# Patient Record
Sex: Male | Born: 2000
Health system: Southern US, Community
[De-identification: ages and names within clinical notes are randomized; demographics above are authoritative.]

## PROBLEM LIST (undated history)

## (undated) DIAGNOSIS — K7290 Hepatic failure, unspecified without coma: Secondary | ICD-10-CM

## (undated) HISTORY — PX: LIVER TRANSPLANT: SHX410

---
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
## 2020-03-07 ENCOUNTER — Ambulatory Visit: Payer: Self-pay

## 2020-07-28 DIAGNOSIS — Z944 Liver transplant status: Secondary | ICD-10-CM | POA: Diagnosis not present

## 2020-09-07 ENCOUNTER — Other Ambulatory Visit (HOSPITAL_COMMUNITY): Payer: Self-pay | Admitting: Pediatric Gastroenterology

## 2020-09-11 DIAGNOSIS — Z419 Encounter for procedure for purposes other than remedying health state, unspecified: Secondary | ICD-10-CM | POA: Diagnosis not present

## 2020-10-12 DIAGNOSIS — Z419 Encounter for procedure for purposes other than remedying health state, unspecified: Secondary | ICD-10-CM | POA: Diagnosis not present

## 2020-10-19 DIAGNOSIS — Z944 Liver transplant status: Secondary | ICD-10-CM | POA: Diagnosis not present

## 2020-11-11 DIAGNOSIS — Z419 Encounter for procedure for purposes other than remedying health state, unspecified: Secondary | ICD-10-CM | POA: Diagnosis not present

## 2020-12-12 DIAGNOSIS — Z419 Encounter for procedure for purposes other than remedying health state, unspecified: Secondary | ICD-10-CM | POA: Diagnosis not present

## 2021-01-12 DIAGNOSIS — Z419 Encounter for procedure for purposes other than remedying health state, unspecified: Secondary | ICD-10-CM | POA: Diagnosis not present

## 2021-02-11 DIAGNOSIS — Z944 Liver transplant status: Secondary | ICD-10-CM | POA: Diagnosis not present

## 2021-02-11 DIAGNOSIS — D849 Immunodeficiency, unspecified: Secondary | ICD-10-CM | POA: Diagnosis not present

## 2021-02-11 DIAGNOSIS — Q447 Other congenital malformations of liver: Secondary | ICD-10-CM | POA: Diagnosis not present

## 2021-02-11 DIAGNOSIS — Z0184 Encounter for antibody response examination: Secondary | ICD-10-CM | POA: Diagnosis not present

## 2021-03-22 ENCOUNTER — Other Ambulatory Visit (HOSPITAL_COMMUNITY): Payer: Self-pay

## 2021-03-22 MED FILL — Tacrolimus Cap 0.5 MG: ORAL | 30 days supply | Qty: 240 | Fill #0 | Status: AC

## 2021-04-05 ENCOUNTER — Emergency Department (HOSPITAL_COMMUNITY)
Admission: EM | Admit: 2021-04-05 | Discharge: 2021-04-05 | Disposition: A | Discharge status: Home / Self Care | Payer: Medicaid Other | Attending: Emergency Medicine | Admitting: Emergency Medicine

## 2021-04-05 ENCOUNTER — Other Ambulatory Visit: Payer: Self-pay

## 2021-04-05 ENCOUNTER — Encounter (HOSPITAL_COMMUNITY): Payer: Self-pay

## 2021-04-05 ENCOUNTER — Emergency Department (HOSPITAL_COMMUNITY): Payer: Medicaid Other

## 2021-04-05 DIAGNOSIS — R1010 Upper abdominal pain, unspecified: Secondary | ICD-10-CM | POA: Insufficient documentation

## 2021-04-05 DIAGNOSIS — R509 Fever, unspecified: Secondary | ICD-10-CM

## 2021-04-05 DIAGNOSIS — R000 Tachycardia, unspecified: Secondary | ICD-10-CM | POA: Insufficient documentation

## 2021-04-05 DIAGNOSIS — R112 Nausea with vomiting, unspecified: Secondary | ICD-10-CM | POA: Insufficient documentation

## 2021-04-05 DIAGNOSIS — Z944 Liver transplant status: Secondary | ICD-10-CM | POA: Insufficient documentation

## 2021-04-05 DIAGNOSIS — R197 Diarrhea, unspecified: Secondary | ICD-10-CM | POA: Diagnosis not present

## 2021-04-05 DIAGNOSIS — R109 Unspecified abdominal pain: Secondary | ICD-10-CM | POA: Diagnosis not present

## 2021-04-05 LAB — COMPREHENSIVE METABOLIC PANEL
ALT: 26 U/L (ref 0–44)
AST: 23 U/L (ref 15–41)
Albumin: 3.6 g/dL (ref 3.5–5.0)
Alkaline Phosphatase: 103 U/L (ref 38–126)
Anion gap: 10 (ref 5–15)
BUN: 16 mg/dL (ref 6–20)
CO2: 22 mmol/L (ref 22–32)
Calcium: 8.6 mg/dL — ABNORMAL LOW (ref 8.9–10.3)
Chloride: 101 mmol/L (ref 98–111)
Creatinine, Ser: 1.06 mg/dL (ref 0.61–1.24)
GFR, Estimated: >60 mL/min (ref >60)
Glucose, Bld: 109 mg/dL — ABNORMAL HIGH (ref 70–99)
Potassium: 3.5 mmol/L (ref 3.5–5.1)
Sodium: 133 mmol/L — ABNORMAL LOW (ref 135–145)
Total Bilirubin: 5.8 mg/dL — ABNORMAL HIGH (ref 0.3–1.2)
Total Protein: 7.1 g/dL (ref 6.5–8.1)

## 2021-04-05 LAB — PROTIME-INR
INR: 1.2 (ref 0.8–1.2)
Prothrombin Time: 15.3 seconds — ABNORMAL HIGH (ref 11.4–15.2)

## 2021-04-05 LAB — CBC
HCT: 44.3 % (ref 39.0–52.0)
Hemoglobin: 15.3 g/dL (ref 13.0–17.0)
MCH: 29.8 pg (ref 26.0–34.0)
MCHC: 34.5 g/dL (ref 30.0–36.0)
MCV: 86.4 fL (ref 80.0–100.0)
Platelets: 155 10*3/uL (ref 150–400)
RBC: 5.13 MIL/uL (ref 4.22–5.81)
RDW: 12 % (ref 11.5–15.5)
WBC: 5.3 10*3/uL (ref 4.0–10.5)
nRBC: 0 % (ref 0.0–0.2)

## 2021-04-05 LAB — URINALYSIS, ROUTINE W REFLEX MICROSCOPIC
Bacteria, UA: NONE SEEN
Bilirubin Urine: NEGATIVE
Glucose, UA: NEGATIVE mg/dL
Ketones, ur: NEGATIVE mg/dL
Leukocytes,Ua: NEGATIVE
Nitrite: NEGATIVE
Protein, ur: 30 mg/dL — AB
Specific Gravity, Urine: 1.023 (ref 1.005–1.030)
pH: 5 (ref 5.0–8.0)

## 2021-04-05 LAB — LACTIC ACID, PLASMA
Lactic Acid, Venous: 1 mmol/L (ref 0.5–1.9)
Lactic Acid, Venous: 1.8 mmol/L (ref 0.5–1.9)

## 2021-04-05 LAB — BILIRUBIN, DIRECT: Bilirubin, Direct: 0.2 mg/dL (ref 0.0–0.2)

## 2021-04-05 LAB — LIPASE, BLOOD: Lipase: 25 U/L (ref 11–51)

## 2021-04-05 MED ORDER — SODIUM CHLORIDE 0.9 % IV BOLUS
1000.0000 mL | Freq: Once | INTRAVENOUS | Status: AC
  Administered 2021-04-05: 1000 mL via INTRAVENOUS

## 2021-04-05 MED ORDER — IOHEXOL 300 MG/ML  SOLN
100.0000 mL | Freq: Once | INTRAMUSCULAR | Status: AC | PRN
  Administered 2021-04-05: 100 mL via INTRAVENOUS

## 2021-04-05 NOTE — ED Provider Notes (Signed)
[facility] EMERGENCY DEPARTMENT Provider Note   CSN: [id number] Arrival date & time: 04/05/21  7824     History Chief Complaint  Patient presents with  . Abdominal Pain    Jermane Brayboy is a 20 y.o. male.  HPI Patient presents with nausea vomiting diarrhea and upper abdominal pain.  Began 3 days ago.  States he did have some chills last night but does not think he is had fevers.  Pain worsened today.  Pain started and then followed with nausea vomiting diarrhea.  Pain in upper abdomen.  No blood in the stool.  He has a history of a liver transplant and is on CellCept and Prograf.  Pain is dull.    Past Medical History:  Diagnosis Date  . Liver failure [facility])     Patient Active Problem List   Diagnosis Date Noted  . Clostridium difficile colitis 10/26/2011  . 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 History of liver transplant (HCC) 10/26/2011    Past Surgical History:  Procedure Laterality Date  . LIVER TRANSPLANT     August 2011 at [facility] History  Problem Relation Age of Onset  . Cancer Maternal Grandmother   . Healthy Mother   . Healthy Father     Social History   Tobacco Use  . Smoking status: Never Smoker  . Smokeless tobacco: Never Used  Substance Use Topics  . Alcohol use: No  . Drug use: No    Home Medications Prior to Admission medications   Medication Sig Start Date End Date Taking? Authorizing Provider  Acetaminophen (TYLENOL PO) Take 10 mLs by mouth every 6 (six) hours as needed (pain/fever).    [provider]  bismuth subsalicylate (PEPTO BISMOL) 262 MG/15ML suspension Take 30 mLs by mouth every 6 (six) hours as needed for indigestion.    [provider]  mycophenolate (CELLCEPT) 500 MG tablet TAKE 1 TABLET (500 MG TOTAL) BY MOUTH 2 (TWO) TIMES DAILY 09/07/20 09/07/21  Philbert Riser, MD  tacrolimus (PROGRAF) 0.5 MG capsule TAKE 4 CAPSULES (2 MG TOTAL) BY MOUTH EVERY 12 (TWELVE) HOURS  09/07/20 09/07/21  Philbert Riser, MD  tacrolimus (PROGRAF) 0.5 mg/ml oral suspension Take 2.5 mg by mouth 2 (two) times daily.    [provider]  valACYclovir (VALTREX) 1000 MG tablet Take 1 tablet (1,000 mg total) by mouth 3 (three) times daily. 06/29/17   Mardella Layman, MD    Allergies    Patient has no known allergies.  Review of Systems   Review of Systems  Constitutional: Positive for appetite change and chills.  HENT: Negative for congestion.   Respiratory: Negative for shortness of breath.   Cardiovascular: Negative for chest pain.  Gastrointestinal: Positive for abdominal pain, diarrhea, nausea and vomiting.  Genitourinary: Negative for flank pain.  Musculoskeletal: Negative for gait problem.  Skin: Negative for rash.  Neurological: Negative for weakness.    Physical Exam Updated Vital Signs BP (!) 120/44   Pulse (!) 116   Temp (!) 100.9 F (38.3 C) (Oral)   Resp 18   SpO2 99%   Physical Exam Vitals and nursing note reviewed.  HENT:     Head: Normocephalic.  Eyes:     General: No scleral icterus. Cardiovascular:     Rate and Rhythm: Regular rhythm. Tachycardia present.  Pulmonary:     Breath sounds: Normal breath sounds.  Abdominal:     Comments: Upper abdominal tenderness without rebound or  guarding.  No hernia palpated.  Skin:    Capillary Refill: Capillary refill takes less than 2 seconds.  Neurological:     Mental Status: He is alert and oriented to person, place, and time.  Psychiatric:        Mood and Affect: Mood normal.     ED Results / Procedures / Treatments   Labs (all labs ordered are listed, but only abnormal results are displayed) Labs Reviewed  COMPREHENSIVE METABOLIC PANEL - Abnormal; Notable for the following components:      Result Value   Sodium 133 (*)    Glucose, Bld 109 (*)    Calcium 8.6 (*)    Total Bilirubin 5.8 (*)    All other components within normal limits  URINALYSIS, ROUTINE W REFLEX MICROSCOPIC -  Abnormal; Notable for the following components:   Hgb urine dipstick SMALL (*)    Protein, ur 30 (*)    All other components within normal limits  CULTURE, BLOOD (ROUTINE X 2)  CULTURE, BLOOD (ROUTINE X 2)  LIPASE, BLOOD  CBC  LACTIC ACID, PLASMA  LACTIC ACID, PLASMA  PROTIME-INR  BILIRUBIN, DIRECT    EKG None  Radiology CT ABDOMEN PELVIS W CONTRAST  Result Date: 04/05/2021 CLINICAL DATA:  Acute left lower quadrant abdominal pain. EXAM: CT ABDOMEN AND PELVIS WITH CONTRAST TECHNIQUE: Multidetector CT imaging of the abdomen and pelvis was performed using the standard protocol following bolus administration of intravenous contrast. CONTRAST:  OMNIPAQUE IOHEXOL 300 MG/ML  SOLN COMPARISON:  None. FINDINGS: Lower chest: No acute abnormality. Hepatobiliary: Status post liver transplant. No biliary dilatation is noted. No definite focal abnormality is noted within the liver. Pancreas: Unremarkable. No pancreatic ductal dilatation or surrounding inflammatory changes. Spleen: Normal in size without focal abnormality. Adrenals/Urinary Tract: Adrenal glands are unremarkable. Probable cortical scarring is seen involving the upper pole the right kidney. No hydronephrosis or renal obstruction is noted. No renal or ureteral calculi are noted. Bladder is unremarkable. Stomach/Bowel: The stomach appears normal. There is no evidence of bowel obstruction or inflammation. The appendix is not clearly visualized, but no inflammation is noted in the right lower quadrant. Vascular/Lymphatic: Splenic, superior mesenteric and portal veins appear to be patent. There are noted collateral veins in the left upper quadrant and left retroperitoneal region which may represent chronic sequela of previous hepatic disease. No enlarged abdominal or pelvic lymph nodes. Reproductive: Prostate is unremarkable. Other: No abdominal wall hernia or abnormality. No abdominopelvic ascites. Musculoskeletal: No acute or significant  osseous findings. IMPRESSION: Status post liver transplant. Enlarged collateral veins are seen involving the left upper quadrant and left retroperitoneal region which may represent chronic sequela of previous hepatic disease. Probable mild cortical scarring is seen involving upper pole of right kidney. No other significant abnormality seen in the abdomen or pelvis. Electronically Signed   By: Lupita Raider M.D.   On: 04/05/2021 12:51    Procedures Procedures   Medications Ordered in ED Medications  sodium chloride 0.9 % bolus 1,000 mL (has no administration in time range)  sodium chloride 0.9 % bolus 1,000 mL (1,000 mLs Intravenous New Bag/Given 04/05/21 1052)  iohexol (OMNIPAQUE) 300 MG/ML solution 100 mL (100 mLs Intravenous Contrast Given 04/05/21 1226)    ED Course  I have reviewed the triage vital signs and the nursing notes.  Pertinent labs & imaging results that were available during my care of the patient were reviewed by me and considered in my medical decision making (see chart for details).  MDM Rules/Calculators/A&P                          Patient presents with nausea vomiting diarrhea.  Has had for around 3 days 2.  Unfortunate patient has had a liver transplant.  Febrile here.  White count reassuring.  Total bili elevated at 5.9.  Appears to have been at 2.7 a few months ago.  Lab work otherwise reassuring.  Still has some mild tachycardia but feeling better.  Normal lactic acid.  CT scan done and reassuring with no biliary dilatation.  Discussed with Dr. Nelson Chimes through pediatric transplant at [facility].  She recommends we add an INR and check a direct bilirubin.  If elevated potentially will be treated for cholangitis.  We will call her back with the results for further recommendations.  Care turned over to Dr Jodi Mourning   Final Clinical Impression(s) / ED Diagnoses Final diagnoses:  Nausea vomiting and diarrhea  Liver transplant recipient [facility])    Rx / DC Orders ED  Discharge Orders    None       Benjiman Core, MD 04/05/21 1447

## 2021-04-05 NOTE — ED Triage Notes (Signed)
Emergency Medicine Provider Triage Evaluation Note  Raekwon Winkowski , a 20 y.o. male  was evaluated in triage.  Pt complains of abd pain, n/v/d x 3 days.  Review of Systems  Positive: LUQ pain, n/v/d Negative: Fever, chills, cp, sob, dysuria, hematuria  Physical Exam  BP 109/60 (BP Location: Left Arm)   Pulse (!) 109   Temp 99.2 F (37.3 C) (Oral)   Resp (!) 23   SpO2 99%  Gen:   Awake, no distress   HEENT:  Atraumatic  Resp:  Normal effort  Cardiac:  Normal rate  Abd:   Tenderness to LUQ MSK:   Moves extremities without difficulty  Neuro:  Speech clear   Medical Decision Making  Medically screening exam initiated at 9:54 AM.  Appropriate orders placed.  Levester Cancelliere was informed that the remainder of the evaluation will be completed by another provider, this initial triage assessment does not replace that evaluation, and the importance of remaining in the ED until their evaluation is complete.  Clinical Impression  abd pain, n/v/d   Fayrene Helper, PA-C 04/05/21 [phone]

## 2021-04-05 NOTE — ED Notes (Signed)
Pt given turkey sandwich and sprite for PO challenge. 

## 2021-04-05 NOTE — Discharge Instructions (Signed)
Follow-up closely with your transplant team for reassessment at minimal over the phone in 48 hours.

## 2021-04-05 NOTE — ED Triage Notes (Signed)
Pt reports abd pain x3 days on LLQ.Marland KitchenPt reports he think it is food poisoning. Pt reports N&V.

## 2021-04-05 NOTE — ED Provider Notes (Signed)
Patient CARE signed out to follow-up additional blood work and update transplant team at [facility].  Blood work reviewed INR normal, bilirubin normal direct.  Updated transplant physician who reviewed results, reassess patient who feels well, IV fluids given, no vomiting.  Transplant physician recommends holding antibiotics at this time, they will follow him up closely this week via phone and possibly in the clinic.  Patient comfortable this plan.  Labs Reviewed  COMPREHENSIVE METABOLIC PANEL - Abnormal; Notable for the following components:      Result Value   Sodium 133 (*)    Glucose, Bld 109 (*)    Calcium 8.6 (*)    Total Bilirubin 5.8 (*)    All other components within normal limits  URINALYSIS, ROUTINE W REFLEX MICROSCOPIC - Abnormal; Notable for the following components:   Hgb urine dipstick SMALL (*)    Protein, ur 30 (*)    All other components within normal limits  PROTIME-INR - Abnormal; Notable for the following components:   Prothrombin Time 15.3 (*)    All other components within normal limits  CULTURE, BLOOD (ROUTINE X 2)  CULTURE, BLOOD (ROUTINE X 2)  LIPASE, BLOOD  CBC  LACTIC ACID, PLASMA  LACTIC ACID, PLASMA  BILIRUBIN, DIRECT      Blane Ohara, MD 04/05/21 1851

## 2021-04-10 LAB — CULTURE, BLOOD (ROUTINE X 2)
Culture: NO GROWTH
Culture: NO GROWTH

## 2021-05-03 ENCOUNTER — Other Ambulatory Visit (HOSPITAL_COMMUNITY): Payer: Self-pay

## 2021-05-03 MED FILL — Mycophenolate Mofetil Tab 500 MG: ORAL | 90 days supply | Qty: 180 | Fill #0 | Status: AC

## 2021-05-03 MED FILL — Tacrolimus Cap 0.5 MG: ORAL | 30 days supply | Qty: 240 | Fill #1 | Status: AC

## 2021-05-06 DIAGNOSIS — Z944 Liver transplant status: Secondary | ICD-10-CM | POA: Diagnosis not present

## 2021-05-06 DIAGNOSIS — K754 Autoimmune hepatitis: Secondary | ICD-10-CM | POA: Diagnosis not present

## 2021-05-06 DIAGNOSIS — D849 Immunodeficiency, unspecified: Secondary | ICD-10-CM | POA: Diagnosis not present

## 2021-05-06 DIAGNOSIS — Z5181 Encounter for therapeutic drug level monitoring: Secondary | ICD-10-CM | POA: Diagnosis not present

## 2021-05-12 ENCOUNTER — Other Ambulatory Visit (HOSPITAL_COMMUNITY): Payer: Self-pay

## 2021-05-12 MED ORDER — TACROLIMUS 0.5 MG PO CAPS
ORAL_CAPSULE | ORAL | 3 refills | Status: DC
  Filled 2021-06-10: qty 270, 30d supply, fill #0
  Filled 2021-07-19 – 2021-07-20 (×2): qty 270, 30d supply, fill #1
  Filled 2021-08-23: qty 270, 30d supply, fill #0
  Filled 2021-09-29: qty 270, 30d supply, fill #1
  Filled 2021-11-15: qty 270, 30d supply, fill #2
  Filled 2022-01-10: qty 270, 30d supply, fill #3
  Filled 2022-02-28: qty 270, 30d supply, fill #4
  Filled 2022-04-20: qty 270, 30d supply, fill #5

## 2021-05-24 DIAGNOSIS — D849 Immunodeficiency, unspecified: Secondary | ICD-10-CM | POA: Diagnosis not present

## 2021-05-24 DIAGNOSIS — Z7185 Encounter for immunization safety counseling: Secondary | ICD-10-CM | POA: Diagnosis not present

## 2021-06-10 ENCOUNTER — Other Ambulatory Visit (HOSPITAL_COMMUNITY): Payer: Self-pay

## 2021-06-29 DIAGNOSIS — Z944 Liver transplant status: Secondary | ICD-10-CM | POA: Diagnosis not present

## 2021-07-19 ENCOUNTER — Other Ambulatory Visit (HOSPITAL_COMMUNITY): Payer: Self-pay

## 2021-07-19 MED FILL — Tacrolimus Cap 0.5 MG: ORAL | 30 days supply | Qty: 240 | Fill #2 | Status: AC

## 2021-07-20 ENCOUNTER — Other Ambulatory Visit (HOSPITAL_COMMUNITY): Payer: Self-pay

## 2021-08-10 DIAGNOSIS — Z944 Liver transplant status: Secondary | ICD-10-CM | POA: Diagnosis not present

## 2021-08-14 DIAGNOSIS — K838 Other specified diseases of biliary tract: Secondary | ICD-10-CM | POA: Diagnosis not present

## 2021-08-18 DIAGNOSIS — Z944 Liver transplant status: Secondary | ICD-10-CM | POA: Diagnosis not present

## 2021-08-18 DIAGNOSIS — D849 Immunodeficiency, unspecified: Secondary | ICD-10-CM | POA: Diagnosis not present

## 2021-08-23 ENCOUNTER — Other Ambulatory Visit (HOSPITAL_COMMUNITY): Payer: Self-pay

## 2021-08-23 DIAGNOSIS — Z944 Liver transplant status: Secondary | ICD-10-CM | POA: Diagnosis not present

## 2021-08-23 MED FILL — Mycophenolate Mofetil Tab 500 MG: ORAL | 90 days supply | Qty: 180 | Fill #0 | Status: AC

## 2021-08-23 MED FILL — Mycophenolate Mofetil Tab 500 MG: ORAL | 90 days supply | Qty: 180 | Fill #1 | Status: CN

## 2021-08-23 MED FILL — Tacrolimus Cap 0.5 MG: ORAL | 30 days supply | Qty: 240 | Fill #3 | Status: CN

## 2021-09-03 DIAGNOSIS — K754 Autoimmune hepatitis: Secondary | ICD-10-CM | POA: Diagnosis not present

## 2021-09-03 DIAGNOSIS — D849 Immunodeficiency, unspecified: Secondary | ICD-10-CM | POA: Diagnosis not present

## 2021-09-03 DIAGNOSIS — Z944 Liver transplant status: Secondary | ICD-10-CM | POA: Diagnosis not present

## 2021-09-03 DIAGNOSIS — Z23 Encounter for immunization: Secondary | ICD-10-CM | POA: Diagnosis not present

## 2021-09-29 ENCOUNTER — Other Ambulatory Visit (HOSPITAL_COMMUNITY): Payer: Self-pay

## 2021-09-30 ENCOUNTER — Other Ambulatory Visit (HOSPITAL_COMMUNITY): Payer: Self-pay

## 2021-10-28 DIAGNOSIS — Z944 Liver transplant status: Secondary | ICD-10-CM | POA: Diagnosis not present

## 2021-11-15 ENCOUNTER — Other Ambulatory Visit (HOSPITAL_COMMUNITY): Payer: Self-pay

## 2022-01-10 ENCOUNTER — Other Ambulatory Visit (HOSPITAL_COMMUNITY): Payer: Self-pay

## 2022-01-13 DIAGNOSIS — Z23 Encounter for immunization: Secondary | ICD-10-CM | POA: Diagnosis not present

## 2022-01-13 DIAGNOSIS — Z4823 Encounter for aftercare following liver transplant: Secondary | ICD-10-CM | POA: Diagnosis not present

## 2022-01-13 DIAGNOSIS — Z944 Liver transplant status: Secondary | ICD-10-CM | POA: Diagnosis not present

## 2022-01-13 DIAGNOSIS — D84821 Immunodeficiency due to drugs: Secondary | ICD-10-CM | POA: Diagnosis not present

## 2022-01-13 DIAGNOSIS — Z79899 Other long term (current) drug therapy: Secondary | ICD-10-CM | POA: Diagnosis not present

## 2022-01-13 DIAGNOSIS — D849 Immunodeficiency, unspecified: Secondary | ICD-10-CM | POA: Diagnosis not present

## 2022-01-13 DIAGNOSIS — K754 Autoimmune hepatitis: Secondary | ICD-10-CM | POA: Diagnosis not present

## 2022-02-09 DIAGNOSIS — Z419 Encounter for procedure for purposes other than remedying health state, unspecified: Secondary | ICD-10-CM | POA: Diagnosis not present

## 2022-02-28 ENCOUNTER — Other Ambulatory Visit (HOSPITAL_COMMUNITY): Payer: Self-pay

## 2022-03-01 ENCOUNTER — Other Ambulatory Visit (HOSPITAL_COMMUNITY): Payer: Self-pay

## 2022-03-01 MED ORDER — MYCOPHENOLATE MOFETIL 500 MG PO TABS
500.0000 mg | ORAL_TABLET | Freq: Two times a day (BID) | ORAL | 3 refills | Status: DC
  Filled 2022-03-01: qty 180, 90d supply, fill #0
  Filled 2022-06-01: qty 180, 90d supply, fill #1
  Filled 2022-12-14: qty 180, 90d supply, fill #2
  Filled 2022-12-15: qty 180, 90d supply, fill #0

## 2022-03-08 DIAGNOSIS — Z23 Encounter for immunization: Secondary | ICD-10-CM | POA: Diagnosis not present

## 2022-03-08 DIAGNOSIS — Z944 Liver transplant status: Secondary | ICD-10-CM | POA: Diagnosis not present

## 2022-03-08 DIAGNOSIS — D849 Immunodeficiency, unspecified: Secondary | ICD-10-CM | POA: Diagnosis not present

## 2022-03-12 DIAGNOSIS — Z419 Encounter for procedure for purposes other than remedying health state, unspecified: Secondary | ICD-10-CM | POA: Diagnosis not present

## 2022-04-11 DIAGNOSIS — Z419 Encounter for procedure for purposes other than remedying health state, unspecified: Secondary | ICD-10-CM | POA: Diagnosis not present

## 2022-04-20 ENCOUNTER — Other Ambulatory Visit (HOSPITAL_COMMUNITY): Payer: Self-pay

## 2022-04-20 DIAGNOSIS — Z944 Liver transplant status: Secondary | ICD-10-CM | POA: Diagnosis not present

## 2022-05-12 DIAGNOSIS — Z419 Encounter for procedure for purposes other than remedying health state, unspecified: Secondary | ICD-10-CM | POA: Diagnosis not present

## 2022-05-27 DIAGNOSIS — D849 Immunodeficiency, unspecified: Secondary | ICD-10-CM | POA: Diagnosis not present

## 2022-05-27 DIAGNOSIS — Z944 Liver transplant status: Secondary | ICD-10-CM | POA: Diagnosis not present

## 2022-06-01 ENCOUNTER — Other Ambulatory Visit (HOSPITAL_COMMUNITY): Payer: Self-pay

## 2022-06-01 MED ORDER — TACROLIMUS 0.5 MG PO CAPS
3.0000 mg | ORAL_CAPSULE | Freq: Two times a day (BID) | ORAL | 3 refills | Status: DC
  Filled 2022-06-01: qty 810, 68d supply, fill #0
  Filled 2022-09-14: qty 810, 68d supply, fill #1
  Filled 2022-12-14: qty 810, 68d supply, fill #2
  Filled 2022-12-15: qty 810, 68d supply, fill #0
  Filled 2023-04-13: qty 810, 68d supply, fill #1

## 2022-06-02 ENCOUNTER — Other Ambulatory Visit (HOSPITAL_COMMUNITY): Payer: Self-pay

## 2022-06-11 DIAGNOSIS — Z419 Encounter for procedure for purposes other than remedying health state, unspecified: Secondary | ICD-10-CM | POA: Diagnosis not present

## 2022-07-12 DIAGNOSIS — Z419 Encounter for procedure for purposes other than remedying health state, unspecified: Secondary | ICD-10-CM | POA: Diagnosis not present

## 2022-07-12 DIAGNOSIS — D849 Immunodeficiency, unspecified: Secondary | ICD-10-CM | POA: Diagnosis not present

## 2022-07-13 DIAGNOSIS — Z944 Liver transplant status: Secondary | ICD-10-CM | POA: Diagnosis not present

## 2022-07-13 DIAGNOSIS — K754 Autoimmune hepatitis: Secondary | ICD-10-CM | POA: Diagnosis not present

## 2022-07-13 DIAGNOSIS — Z23 Encounter for immunization: Secondary | ICD-10-CM | POA: Diagnosis not present

## 2022-07-13 DIAGNOSIS — D849 Immunodeficiency, unspecified: Secondary | ICD-10-CM | POA: Diagnosis not present

## 2022-08-12 DIAGNOSIS — Z419 Encounter for procedure for purposes other than remedying health state, unspecified: Secondary | ICD-10-CM | POA: Diagnosis not present

## 2022-09-11 DIAGNOSIS — Z419 Encounter for procedure for purposes other than remedying health state, unspecified: Secondary | ICD-10-CM | POA: Diagnosis not present

## 2022-09-14 ENCOUNTER — Other Ambulatory Visit (HOSPITAL_COMMUNITY): Payer: Self-pay

## 2022-09-15 ENCOUNTER — Other Ambulatory Visit (HOSPITAL_COMMUNITY): Payer: Self-pay

## 2022-10-12 DIAGNOSIS — Z419 Encounter for procedure for purposes other than remedying health state, unspecified: Secondary | ICD-10-CM | POA: Diagnosis not present

## 2022-11-11 DIAGNOSIS — Z419 Encounter for procedure for purposes other than remedying health state, unspecified: Secondary | ICD-10-CM | POA: Diagnosis not present

## 2022-12-12 DIAGNOSIS — Z419 Encounter for procedure for purposes other than remedying health state, unspecified: Secondary | ICD-10-CM | POA: Diagnosis not present

## 2022-12-15 ENCOUNTER — Other Ambulatory Visit (HOSPITAL_COMMUNITY): Payer: Self-pay

## 2022-12-16 ENCOUNTER — Other Ambulatory Visit (HOSPITAL_COMMUNITY): Payer: Self-pay

## 2023-01-12 DIAGNOSIS — Z419 Encounter for procedure for purposes other than remedying health state, unspecified: Secondary | ICD-10-CM | POA: Diagnosis not present

## 2023-01-18 ENCOUNTER — Other Ambulatory Visit (HOSPITAL_COMMUNITY): Payer: Self-pay

## 2023-01-30 ENCOUNTER — Other Ambulatory Visit (HOSPITAL_COMMUNITY): Payer: Self-pay

## 2023-02-10 DIAGNOSIS — Z419 Encounter for procedure for purposes other than remedying health state, unspecified: Secondary | ICD-10-CM | POA: Diagnosis not present

## 2023-02-16 IMAGING — CT CT ABD-PELV W/ CM
2 series · 13 of 23 positions shown, 18 images · IV contrast (Omni 300)
Comparison: None.

CLINICAL DATA: Acute left lower quadrant abdominal pain.

EXAM:
CT ABDOMEN AND PELVIS WITH CONTRAST
TECHNIQUE: Multidetector CT imaging of the abdomen and pelvis was performed
using the standard protocol following bolus administration of
intravenous contrast.
CONTRAST:  100mL OMNIPAQUE IOHEXOL 300 MG/ML  SOLN

[Series 4: lung · axial · 0.71mm/px · z∈[-111,-31]mm · 12 of 19 slices shown, 16 images]
[im 2/19  soft-tissue]
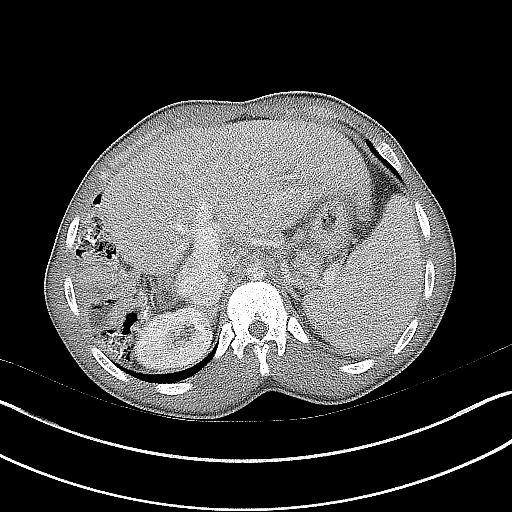
[im 2/19  bone]
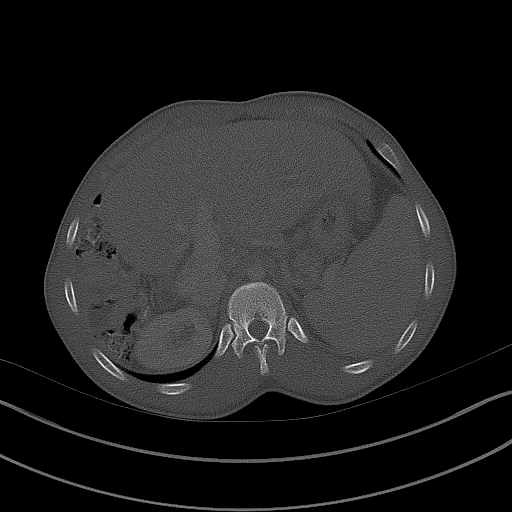
[im 4/19  soft-tissue]
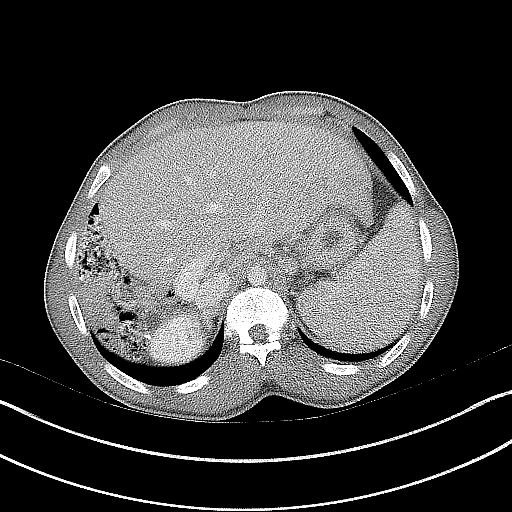
[im 6/19  soft-tissue]
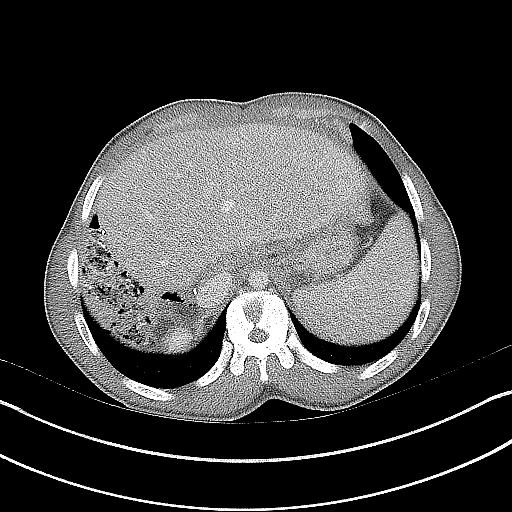
[im 7/19  soft-tissue]
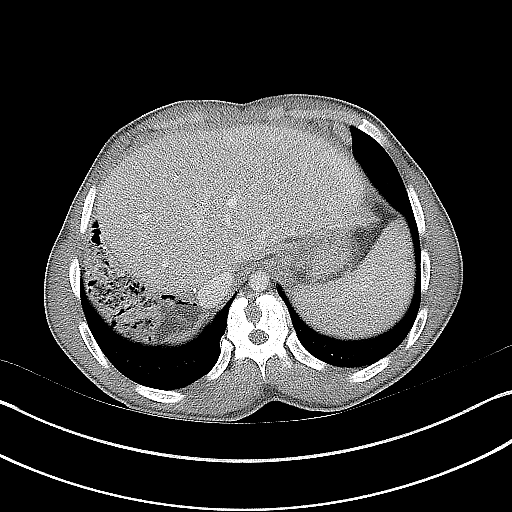
[im 9/19  soft-tissue]
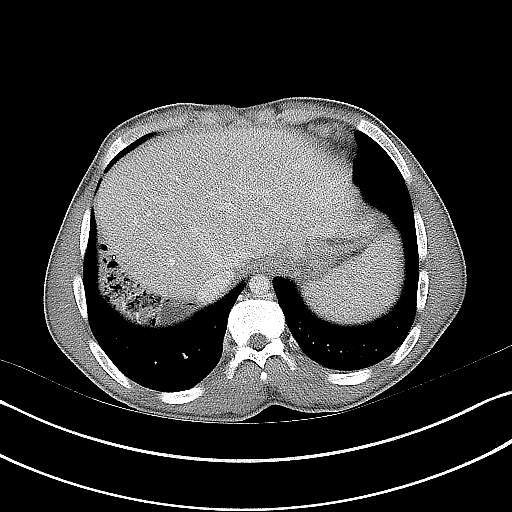
[im 11/19  soft-tissue]
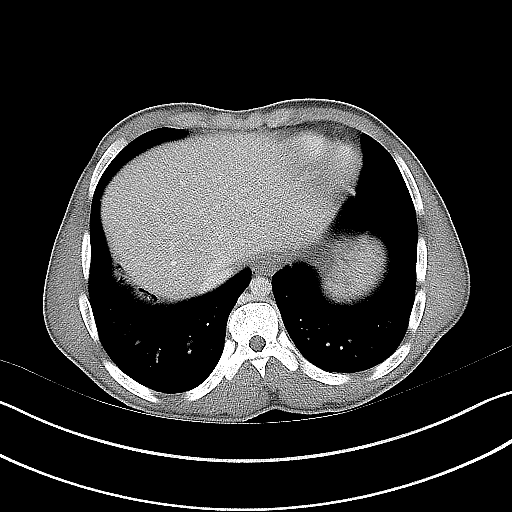
[im 13/19  soft-tissue]
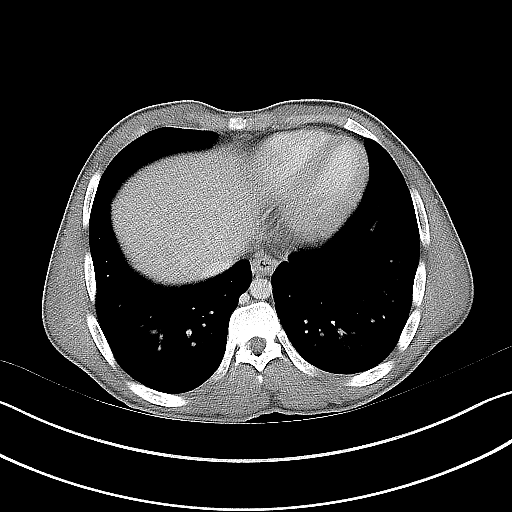
[im 14/19  soft-tissue]
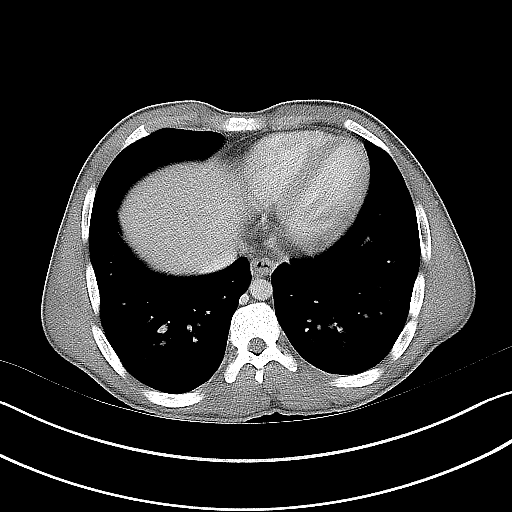
[im 15/19  lung]
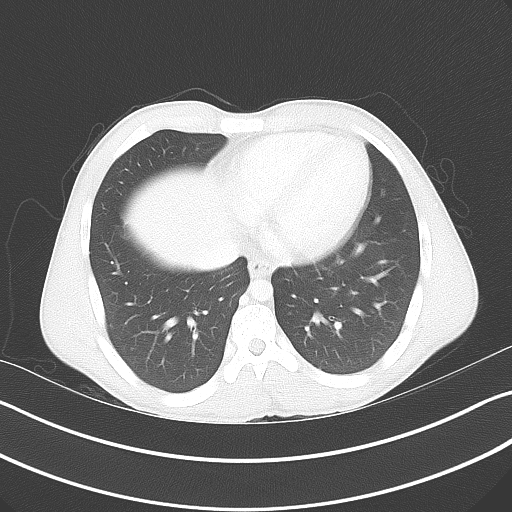
[im 16/19  soft-tissue]
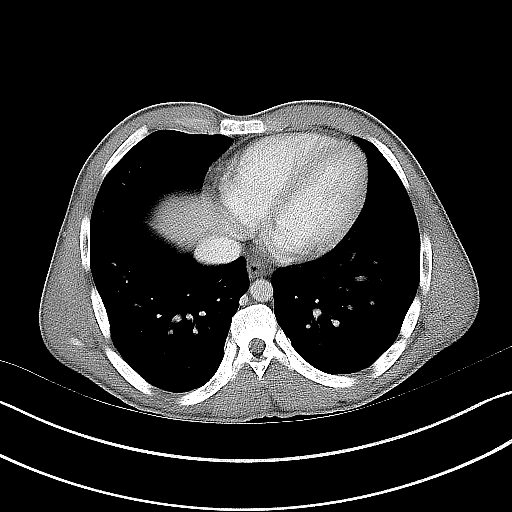
[im 16/19  lung]
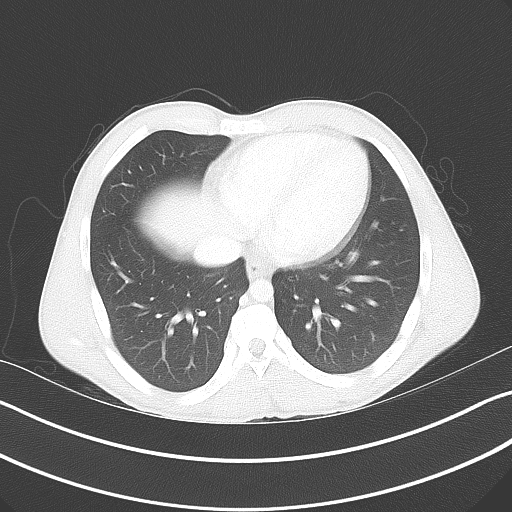
[im 16/19  bone]
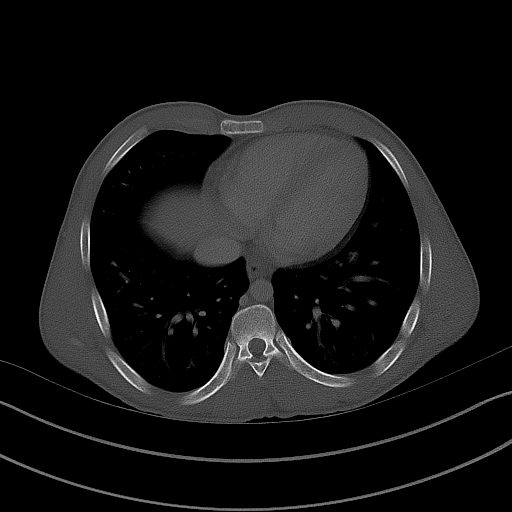
[im 17/19  lung]
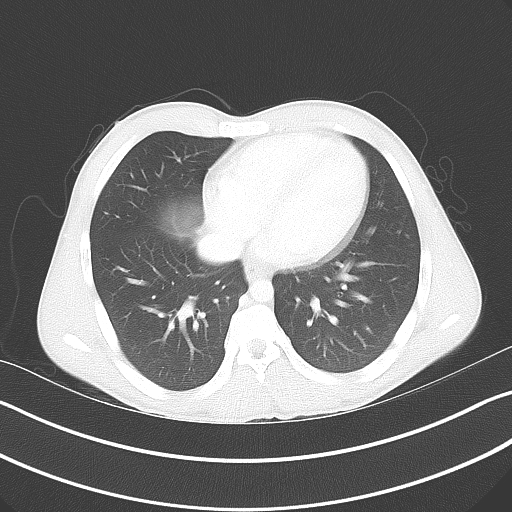
[im 18/19  soft-tissue]
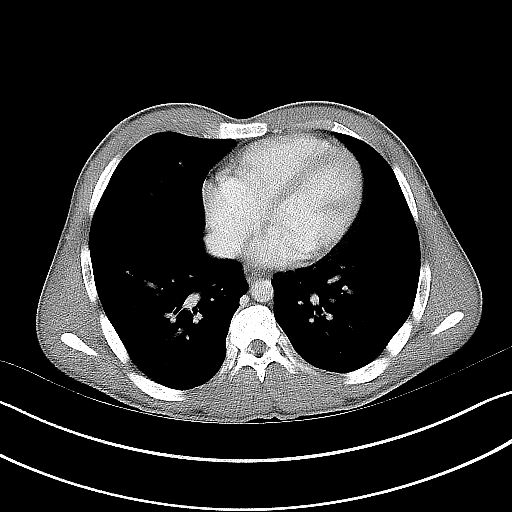
[im 18/19  lung]
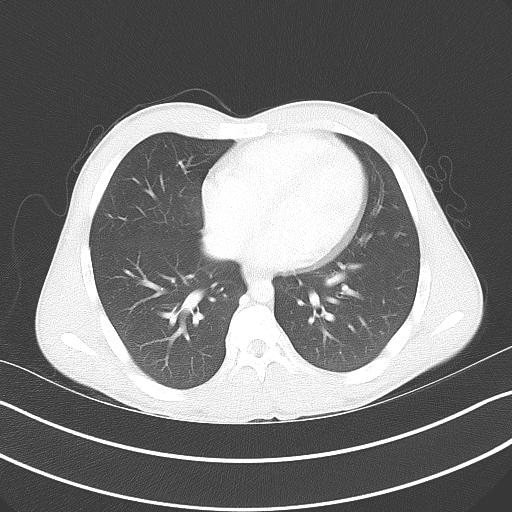

[Series 7: a/p w/ sag · sagittal · 0.57mm/px · 1 of 144 slices shown, 2 images]
[im 48/144  soft-tissue]
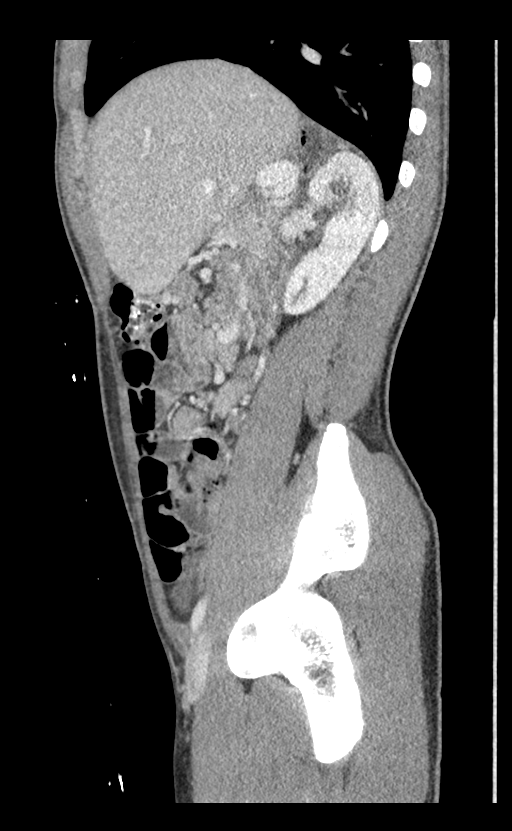
[im 48/144  bone]
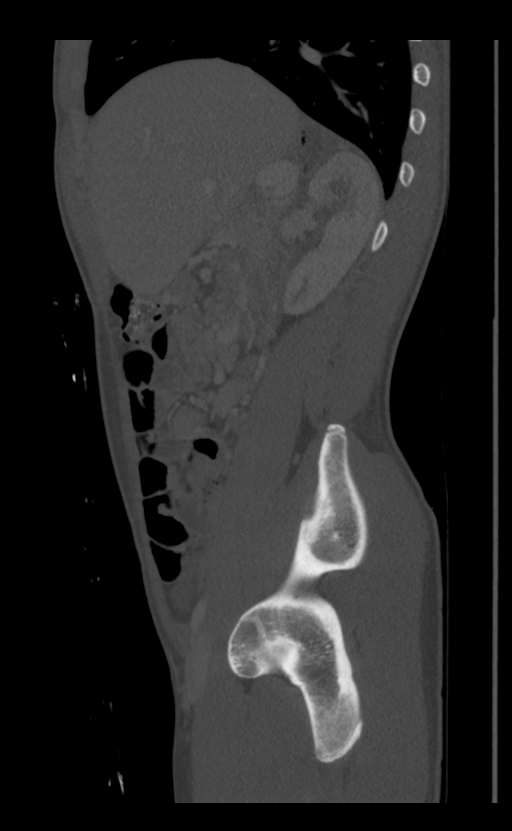

[13 of 23 positions shown; findings below may reference images not displayed]

FINDINGS: Lower chest: No acute abnormality.

Hepatobiliary: Status post liver transplant. No biliary dilatation
is noted. No definite focal abnormality is noted within the liver.

Pancreas: Unremarkable. No pancreatic ductal dilatation or
surrounding inflammatory changes.

Spleen: Normal in size without focal abnormality.

Adrenals/Urinary Tract: Adrenal glands are unremarkable. Probable
cortical scarring is seen involving the upper pole the right kidney.
No hydronephrosis or renal obstruction is noted. No renal or
ureteral calculi are noted. Bladder is unremarkable.

Stomach/Bowel: The stomach appears normal. There is no evidence of
bowel obstruction or inflammation. The appendix is not clearly
visualized, but no inflammation is noted in the right lower
quadrant.

Vascular/Lymphatic: Splenic, superior mesenteric and portal veins
appear to be patent. There are noted collateral veins in the left
upper quadrant and left retroperitoneal region which may represent
chronic sequela of previous hepatic disease. No enlarged abdominal
or pelvic lymph nodes.

Reproductive: Prostate is unremarkable.

Other: No abdominal wall hernia or abnormality. No abdominopelvic
ascites.

Musculoskeletal: No acute or significant osseous findings.
IMPRESSION: Status post liver transplant.

Enlarged collateral veins are seen involving the left upper quadrant
and left retroperitoneal region which may represent chronic sequela
of previous hepatic disease.

Probable mild cortical scarring is seen involving upper pole of
right kidney.

No other significant abnormality seen in the abdomen or pelvis.

## 2023-02-20 DIAGNOSIS — D849 Immunodeficiency, unspecified: Secondary | ICD-10-CM | POA: Diagnosis not present

## 2023-02-20 DIAGNOSIS — Z944 Liver transplant status: Secondary | ICD-10-CM | POA: Diagnosis not present

## 2023-02-20 DIAGNOSIS — R748 Abnormal levels of other serum enzymes: Secondary | ICD-10-CM | POA: Diagnosis not present

## 2023-03-13 DIAGNOSIS — Z419 Encounter for procedure for purposes other than remedying health state, unspecified: Secondary | ICD-10-CM | POA: Diagnosis not present

## 2023-03-29 DIAGNOSIS — Z944 Liver transplant status: Secondary | ICD-10-CM | POA: Diagnosis not present

## 2023-03-29 DIAGNOSIS — D849 Immunodeficiency, unspecified: Secondary | ICD-10-CM | POA: Diagnosis not present

## 2023-04-12 DIAGNOSIS — Z419 Encounter for procedure for purposes other than remedying health state, unspecified: Secondary | ICD-10-CM | POA: Diagnosis not present

## 2023-04-13 ENCOUNTER — Other Ambulatory Visit (HOSPITAL_COMMUNITY): Payer: Self-pay

## 2023-04-13 ENCOUNTER — Other Ambulatory Visit: Payer: Self-pay

## 2023-04-13 MED ORDER — MYCOPHENOLATE MOFETIL 500 MG PO TABS
500.0000 mg | ORAL_TABLET | Freq: Two times a day (BID) | ORAL | 3 refills | Status: DC
  Filled 2023-04-13 (×2): qty 180, 90d supply, fill #0
  Filled 2023-08-10: qty 180, 90d supply, fill #1

## 2023-04-14 ENCOUNTER — Other Ambulatory Visit (HOSPITAL_COMMUNITY): Payer: Self-pay

## 2023-05-13 DIAGNOSIS — Z419 Encounter for procedure for purposes other than remedying health state, unspecified: Secondary | ICD-10-CM | POA: Diagnosis not present

## 2023-06-12 DIAGNOSIS — Z419 Encounter for procedure for purposes other than remedying health state, unspecified: Secondary | ICD-10-CM | POA: Diagnosis not present

## 2023-07-13 DIAGNOSIS — Z419 Encounter for procedure for purposes other than remedying health state, unspecified: Secondary | ICD-10-CM | POA: Diagnosis not present

## 2023-08-10 ENCOUNTER — Other Ambulatory Visit (HOSPITAL_COMMUNITY): Payer: Self-pay

## 2023-08-11 ENCOUNTER — Other Ambulatory Visit: Payer: Self-pay

## 2023-08-11 ENCOUNTER — Other Ambulatory Visit (HOSPITAL_COMMUNITY): Payer: Self-pay

## 2023-08-11 MED ORDER — TACROLIMUS 0.5 MG PO CAPS
3.0000 mg | ORAL_CAPSULE | Freq: Two times a day (BID) | ORAL | 3 refills | Status: AC
  Filled 2023-08-11: qty 810, 68d supply, fill #0
  Filled 2023-11-22: qty 810, 68d supply, fill #1
  Filled 2024-04-17: qty 810, 68d supply, fill #2

## 2023-08-13 DIAGNOSIS — Z419 Encounter for procedure for purposes other than remedying health state, unspecified: Secondary | ICD-10-CM | POA: Diagnosis not present

## 2023-08-19 ENCOUNTER — Other Ambulatory Visit (HOSPITAL_COMMUNITY): Payer: Self-pay

## 2023-09-12 DIAGNOSIS — Z419 Encounter for procedure for purposes other than remedying health state, unspecified: Secondary | ICD-10-CM | POA: Diagnosis not present

## 2023-10-13 DIAGNOSIS — Z419 Encounter for procedure for purposes other than remedying health state, unspecified: Secondary | ICD-10-CM | POA: Diagnosis not present

## 2023-11-12 DIAGNOSIS — Z419 Encounter for procedure for purposes other than remedying health state, unspecified: Secondary | ICD-10-CM | POA: Diagnosis not present

## 2023-11-22 ENCOUNTER — Other Ambulatory Visit: Payer: Self-pay

## 2023-12-13 DIAGNOSIS — Z419 Encounter for procedure for purposes other than remedying health state, unspecified: Secondary | ICD-10-CM | POA: Diagnosis not present

## 2024-01-13 DIAGNOSIS — Z419 Encounter for procedure for purposes other than remedying health state, unspecified: Secondary | ICD-10-CM | POA: Diagnosis not present

## 2024-02-05 DIAGNOSIS — Z944 Liver transplant status: Secondary | ICD-10-CM | POA: Diagnosis not present

## 2024-02-05 DIAGNOSIS — D849 Immunodeficiency, unspecified: Secondary | ICD-10-CM | POA: Diagnosis not present

## 2024-02-10 DIAGNOSIS — Z419 Encounter for procedure for purposes other than remedying health state, unspecified: Secondary | ICD-10-CM | POA: Diagnosis not present

## 2024-03-23 DIAGNOSIS — Z419 Encounter for procedure for purposes other than remedying health state, unspecified: Secondary | ICD-10-CM | POA: Diagnosis not present

## 2024-04-17 ENCOUNTER — Other Ambulatory Visit (HOSPITAL_COMMUNITY): Payer: Self-pay

## 2024-04-17 ENCOUNTER — Other Ambulatory Visit: Payer: Self-pay

## 2024-04-18 ENCOUNTER — Other Ambulatory Visit (HOSPITAL_COMMUNITY): Payer: Self-pay

## 2024-04-19 ENCOUNTER — Other Ambulatory Visit (HOSPITAL_COMMUNITY): Payer: Self-pay

## 2024-04-19 MED ORDER — MYCOPHENOLATE MOFETIL 500 MG PO TABS
500.0000 mg | ORAL_TABLET | Freq: Two times a day (BID) | ORAL | 0 refills | Status: DC
  Filled 2024-04-19: qty 180, 90d supply, fill #0

## 2024-04-22 DIAGNOSIS — Z419 Encounter for procedure for purposes other than remedying health state, unspecified: Secondary | ICD-10-CM | POA: Diagnosis not present

## 2024-05-08 DIAGNOSIS — Z941 Heart transplant status: Secondary | ICD-10-CM | POA: Diagnosis not present

## 2024-05-08 DIAGNOSIS — D849 Immunodeficiency, unspecified: Secondary | ICD-10-CM | POA: Diagnosis not present

## 2024-05-08 DIAGNOSIS — Z944 Liver transplant status: Secondary | ICD-10-CM | POA: Diagnosis not present

## 2024-05-15 ENCOUNTER — Other Ambulatory Visit: Payer: Self-pay

## 2024-05-15 MED ORDER — TACROLIMUS 1 MG PO CAPS
3.0000 mg | ORAL_CAPSULE | Freq: Two times a day (BID) | ORAL | 3 refills | Status: AC
  Filled 2024-05-15 – 2024-09-19 (×3): qty 540, 90d supply, fill #0

## 2024-05-15 MED ORDER — MYCOPHENOLATE MOFETIL 500 MG PO TABS
500.0000 mg | ORAL_TABLET | Freq: Two times a day (BID) | ORAL | 3 refills | Status: AC
  Filled 2024-09-02 – 2024-11-09 (×5): qty 180, 90d supply, fill #0

## 2024-05-23 DIAGNOSIS — Z419 Encounter for procedure for purposes other than remedying health state, unspecified: Secondary | ICD-10-CM | POA: Diagnosis not present

## 2024-05-24 ENCOUNTER — Other Ambulatory Visit: Payer: Self-pay

## 2024-06-22 DIAGNOSIS — Z419 Encounter for procedure for purposes other than remedying health state, unspecified: Secondary | ICD-10-CM | POA: Diagnosis not present

## 2024-07-23 DIAGNOSIS — Z419 Encounter for procedure for purposes other than remedying health state, unspecified: Secondary | ICD-10-CM | POA: Diagnosis not present

## 2024-07-26 DIAGNOSIS — Z944 Liver transplant status: Secondary | ICD-10-CM | POA: Diagnosis not present

## 2024-07-26 DIAGNOSIS — K743 Primary biliary cirrhosis: Secondary | ICD-10-CM | POA: Diagnosis not present

## 2024-07-26 DIAGNOSIS — D849 Immunodeficiency, unspecified: Secondary | ICD-10-CM | POA: Diagnosis not present

## 2024-08-23 DIAGNOSIS — Z419 Encounter for procedure for purposes other than remedying health state, unspecified: Secondary | ICD-10-CM | POA: Diagnosis not present

## 2024-09-02 ENCOUNTER — Other Ambulatory Visit (HOSPITAL_COMMUNITY): Payer: Self-pay

## 2024-09-02 ENCOUNTER — Other Ambulatory Visit: Payer: Self-pay

## 2024-09-09 ENCOUNTER — Other Ambulatory Visit: Payer: Self-pay

## 2024-09-11 ENCOUNTER — Other Ambulatory Visit: Payer: Self-pay

## 2024-09-12 ENCOUNTER — Other Ambulatory Visit (HOSPITAL_COMMUNITY): Payer: Self-pay

## 2024-09-19 ENCOUNTER — Other Ambulatory Visit: Payer: Self-pay

## 2024-09-19 ENCOUNTER — Other Ambulatory Visit (HOSPITAL_COMMUNITY): Payer: Self-pay

## 2024-09-29 ENCOUNTER — Other Ambulatory Visit (HOSPITAL_COMMUNITY): Payer: Self-pay

## 2024-10-30 ENCOUNTER — Other Ambulatory Visit: Payer: Self-pay

## 2024-11-08 ENCOUNTER — Other Ambulatory Visit: Payer: Self-pay

## 2024-11-09 ENCOUNTER — Other Ambulatory Visit: Payer: Self-pay

## 2024-11-09 ENCOUNTER — Other Ambulatory Visit (HOSPITAL_BASED_OUTPATIENT_CLINIC_OR_DEPARTMENT_OTHER): Payer: Self-pay

## 2024-11-09 ENCOUNTER — Other Ambulatory Visit (HOSPITAL_COMMUNITY): Payer: Self-pay
